# Patient Record
Sex: Female | Born: 1975 | Race: White | Hispanic: No | Marital: Married | Smoking: Former smoker
Health system: Southern US, Community
[De-identification: ages and names within clinical notes are randomized; demographics above are authoritative.]

## PROBLEM LIST (undated history)

## (undated) DIAGNOSIS — D333 Benign neoplasm of cranial nerves: Secondary | ICD-10-CM

## (undated) HISTORY — PX: HERNIA REPAIR: SHX51

## (undated) HISTORY — PX: OTHER SURGICAL HISTORY: SHX169

## (undated) HISTORY — PX: ABDOMINAL HYSTERECTOMY: SHX81

---

## 2016-04-12 ENCOUNTER — Emergency Department (HOSPITAL_COMMUNITY)
Admission: EM | Admit: 2016-04-12 | Discharge: 2016-04-12 | Disposition: A | Discharge status: Home / Self Care | Payer: 59 | Attending: Emergency Medicine | Admitting: Emergency Medicine

## 2016-04-12 ENCOUNTER — Emergency Department (HOSPITAL_COMMUNITY): Payer: 59

## 2016-04-12 ENCOUNTER — Encounter (HOSPITAL_COMMUNITY): Payer: Self-pay

## 2016-04-12 DIAGNOSIS — Z87891 Personal history of nicotine dependence: Secondary | ICD-10-CM | POA: Insufficient documentation

## 2016-04-12 DIAGNOSIS — R079 Chest pain, unspecified: Secondary | ICD-10-CM | POA: Diagnosis present

## 2016-04-12 DIAGNOSIS — K219 Gastro-esophageal reflux disease without esophagitis: Secondary | ICD-10-CM | POA: Insufficient documentation

## 2016-04-12 DIAGNOSIS — R0789 Other chest pain: Secondary | ICD-10-CM | POA: Diagnosis not present

## 2016-04-12 DIAGNOSIS — Z86018 Personal history of other benign neoplasm: Secondary | ICD-10-CM | POA: Diagnosis not present

## 2016-04-12 HISTORY — DX: Benign neoplasm of cranial nerves: D33.3

## 2016-04-12 LAB — CBC
HEMATOCRIT: 39 % (ref 36.0–46.0)
Hemoglobin: 12.8 g/dL (ref 12.0–15.0)
MCH: 30.3 pg (ref 26.0–34.0)
MCHC: 32.8 g/dL (ref 30.0–36.0)
MCV: 92.2 fL (ref 78.0–100.0)
Platelets: 266 10*3/uL (ref 150–400)
RBC: 4.23 MIL/uL (ref 3.87–5.11)
RDW: 14 % (ref 11.5–15.5)
WBC: 6.8 10*3/uL (ref 4.0–10.5)

## 2016-04-12 LAB — BASIC METABOLIC PANEL
Anion gap: 9 (ref 5–15)
BUN: 9 mg/dL (ref 6–20)
CO2: 21 mmol/L — ABNORMAL LOW (ref 22–32)
CREATININE: 0.61 mg/dL (ref 0.44–1.00)
Calcium: 8.8 mg/dL — ABNORMAL LOW (ref 8.9–10.3)
Chloride: 107 mmol/L (ref 101–111)
GFR calc Af Amer: >60 mL/min (ref >60)
GLUCOSE: 102 mg/dL — AB (ref 65–99)
POTASSIUM: 4.3 mmol/L (ref 3.5–5.1)
Sodium: 137 mmol/L (ref 135–145)

## 2016-04-12 LAB — I-STAT TROPONIN, ED: Troponin i, poc: 0.02 ng/mL (ref 0.00–0.08)

## 2016-04-12 MED ORDER — PANTOPRAZOLE SODIUM 20 MG PO TBEC: 20.0000 mg | DELAYED_RELEASE_TABLET | Freq: Every day | ORAL | 0 refills | Status: AC

## 2016-04-12 MED ORDER — GI COCKTAIL ~~LOC~~
30.0000 mL | Freq: Once | ORAL | Status: AC
  Administered 2016-04-12: 30 mL via ORAL
  Filled 2016-04-12: qty 30

## 2016-04-12 MED ORDER — ONDANSETRON 4 MG PO TBDP
4.0000 mg | ORAL_TABLET | Freq: Once | ORAL | Status: AC
  Administered 2016-04-12: 4 mg via ORAL
  Filled 2016-04-12: qty 1

## 2016-04-12 MED ORDER — ONDANSETRON 4 MG PO TBDP: 4.0000 mg | ORAL_TABLET | Freq: Three times a day (TID) | ORAL | 0 refills | Status: AC | PRN

## 2016-04-12 NOTE — ED Notes (Signed)
Pt states that nausea and vomitting have improved PA made aware will discuss discharge with pt.

## 2016-04-12 NOTE — ED Notes (Signed)
Pt brought in to the room appears in no distress husband with pt ekg complete pt placed on monitor safety maintained

## 2016-04-12 NOTE — ED Provider Notes (Signed)
[facility] DEPT Provider Note   CSN: [id number] Arrival date & time: 04/12/16  1158     History   Chief Complaint Chief Complaint  Patient presents with  . Chest Pain    pt c/o central chest pain that radiates to the back as well as heartburn pain    HPI Chelsey Watson is a 40 y.o. female.  HPI   Patient is a 40 year old female presents to the emergency room with complaint of upper central chest pain that radiates up her neck, throat and back that has gradually worsened today after onset at 9 AM this morning. Pain is described as burning and sharp with associated reflux.  Her pain has been constant since 9 AM this morning, rated 4/10. No improvement after eating a banana.  She reports some anxiety after the onset of her chest pain with associated intermittent palpitations and shortness of breath. There is no change in severity of pain with deep inspiration, exertion, positional changes, movement or palpation.  No other alleviating or aggravating factors. She denies cough, wheeze, fever, chills, sweats, orthopnea, lower extremity edema, PND, nausea, vomiting, abdominal pain, near syncope.  She Quit smoking 4 years ago. She denies any history of hypertension, diabetes, anxiety.   Past Medical History:  Diagnosis Date  . Acoustic neuroma (HCC)     There are no active problems to display for this patient.   Past Surgical History:  Procedure Laterality Date  . ABDOMINAL HYSTERECTOMY    . gamma knife    . HERNIA REPAIR    . laporostomy      OB History    No data available       Home Medications    Prior to Admission medications   Medication Sig Start Date End Date Taking? Authorizing Provider  valACYclovir (VALTREX) 1000 MG tablet Take 1,000 mg by mouth 2 (two) times daily.   Yes Historical Provider, MD  ondansetron (ZOFRAN ODT) 4 MG disintegrating tablet Take 1 tablet (4 mg total) by mouth every 8 (eight) hours as needed for nausea or vomiting. 04/12/16   Delsa Grana, PA-C  pantoprazole (PROTONIX) 20 MG tablet Take 1 tablet (20 mg total) by mouth daily. 04/12/16   Delsa Grana, PA-C    Family History No family history on file.  Social History Social History  Substance Use Topics  . Smoking status: Former Research scientist (life sciences)  . Smokeless tobacco: Never Used  . Alcohol use Yes     Allergies   Amitriptyline; Erythromycin; Neurontin [gabapentin]; and Penicillins   Review of Systems Review of Systems  Constitutional: Negative for activity change, appetite change, chills, diaphoresis, fatigue and fever.  HENT: Negative.  Negative for congestion.   Eyes: Negative.   Respiratory: Negative for apnea, cough, chest tightness, wheezing and stridor.   Cardiovascular: Positive for chest pain and palpitations. Negative for leg swelling.  Gastrointestinal: Negative for abdominal distention, abdominal pain, diarrhea, nausea and vomiting.  Endocrine: Negative.   Genitourinary: Negative.   Musculoskeletal: Negative.   Skin: Negative.  Negative for color change and pallor.  Allergic/Immunologic: Negative.   Neurological: Negative for syncope, weakness and numbness.  Psychiatric/Behavioral: The patient is nervous/anxious.   All other systems reviewed and are negative.    Physical Exam Updated Vital Signs BP 108/73   Pulse 60   Temp 98.1 F (36.7 C) (Oral)   Resp 15   Ht 4' 10"  (1.473 m)   Wt 73.5 kg   SpO2 98%   BMI 33.86 kg/m   Physical Exam  Constitutional: She is oriented to person, place, and time. Vital signs are normal. She appears well-developed and well-nourished. She is cooperative.  Non-toxic appearance. She does not have a sickly appearance. She does not appear ill. No distress.  HENT:  Head: Normocephalic and atraumatic.  Nose: Nose normal.  Mouth/Throat: Oropharynx is clear and moist. No oropharyngeal exudate.  Eyes: Conjunctivae and EOM are normal. Pupils are equal, round, and reactive to light. Right eye exhibits no discharge. Left eye  exhibits no discharge. No scleral icterus.  Neck: Normal range of motion. Neck supple. No JVD present. No tracheal deviation present.  Cardiovascular: Normal rate, regular rhythm, normal heart sounds and intact distal pulses.   No extrasystoles are present. PMI is not displaced.  Exam reveals no gallop and no friction rub.   No murmur heard. No lower extremity edema, symmetrical radial pulse 2+, dorsal pedis pulses symmetrical 2+  Pulmonary/Chest: Effort normal and breath sounds normal. No accessory muscle usage or stridor. No tachypnea. No respiratory distress. She has no decreased breath sounds. She has no wheezes. She has no rhonchi. She has no rales. She exhibits no tenderness.  Abdominal: Soft. Bowel sounds are normal. She exhibits no distension and no mass. There is no tenderness. There is no rebound and no guarding.  Musculoskeletal: Normal range of motion. She exhibits no deformity.  Neurological: She is alert and oriented to person, place, and time. She exhibits normal muscle tone. Coordination normal.  Skin: Skin is warm and dry. Capillary refill takes less than 2 seconds. No rash noted. She is not diaphoretic. No erythema. No pallor.  Psychiatric: She has a normal mood and affect. Her behavior is normal. Judgment and thought content normal.  Nursing note and vitals reviewed.    ED Treatments / Results  Labs (all labs ordered are listed, but only abnormal results are displayed) Labs Reviewed  BASIC METABOLIC PANEL - Abnormal; Notable for the following:       Result Value   CO2 21 (*)    Glucose, Bld 102 (*)    Calcium 8.8 (*)    All other components within normal limits  CBC  I-STAT TROPOININ, ED    EKG  EKG Interpretation  Date/Time:  Monday April 12 2016 12:06:11 EDT Ventricular Rate:  63 PR Interval:    QRS Duration: 94 QT Interval:  459 QTC Calculation: 470 R Axis:   79 Text Interpretation:  Sinus rhythm Low voltage, precordial leads Confirmed by Jeneen Rinks  MD,  [place] ([id number]) on 04/12/2016 4:21:19 PM       Radiology Dg Chest 2 View  Result Date: 04/12/2016 CLINICAL DATA:  Mid chest pain radiating to the back EXAM: CHEST  2 VIEW COMPARISON:  None. FINDINGS: There is mild bilateral interstitial thickening. There is no focal parenchymal opacity. There is no pleural effusion or pneumothorax. The heart size is top normal. The osseous structures are unremarkable. IMPRESSION: No active cardiopulmonary disease. Electronically Signed   By: Kathreen Devoid   On: 04/12/2016 13:16    Procedures Procedures (including critical care time)  Medications Ordered in ED Medications  gi cocktail (Maalox,Lidocaine,Donnatal) (30 mLs Oral Given 04/12/16 1344)  ondansetron (ZOFRAN-ODT) disintegrating tablet 4 mg (4 mg Oral Given 04/12/16 1353)     Initial Impression / Assessment and Plan / ED Course  I have reviewed the triage vital signs and the nursing notes.  Pertinent labs & imaging results that were available during my care of the patient were reviewed by me and considered in my medical  decision making (see chart for details).  Clinical Course  40 year old female presents with high central chest pain described as burning and sharp with associated acid reflux.   History non-concerning for ACS, patient is low risk, heart score of 1 - parents with heart disease.   Patient complained of palpitations, cardiac monitoring was reviewed, w/o arrhythmia.   GI cocktail improved pain while in the ER.   Patient is to be discharged with recommendation to follow up with PCP in regards to today's hospital visit. Chest pain is not likely of cardiac or pulmonary etiology d/t presentation, perc negative, VSS, no tracheal deviation, no JVD or new murmur, RRR, breath sounds equal bilaterally, EKG without acute abnormalities, negative troponin, and negative CXR. Pt has been advised start a PPI and return to the ED is CP becomes exertional, associated with diaphoresis or nausea, radiates to  left jaw/arm, worsens or becomes concerning in any way.  Pt agrees to follow up with her PCP this week.   Final Clinical Impressions(s) / ED Diagnoses   Final diagnoses:  Atypical chest pain  Gastroesophageal reflux disease, esophagitis presence not specified    New Prescriptions Discharge Medication List as of 04/12/2016  2:32 PM    START taking these medications   Details  ondansetron (ZOFRAN ODT) 4 MG disintegrating tablet Take 1 tablet (4 mg total) by mouth every 8 (eight) hours as needed for nausea or vomiting., Starting Mon 04/12/2016, Print    pantoprazole (PROTONIX) 20 MG tablet Take 1 tablet (20 mg total) by mouth daily., Starting Mon 04/12/2016, Print         Delsa Grana, PA-C 04/30/16 2202    Tanna Furry, MD 05/16/16 2030

## 2016-04-29 ENCOUNTER — Telehealth: Payer: Self-pay | Admitting: *Deleted

## 2016-04-29 NOTE — Telephone Encounter (Signed)
Pharmacy called related to Rx: ondansetron (ZOFRAN ODT) 4 MG disintegrating tablet not being covered by insurance...EDCM clarified with EDP ([facility]) to change Rx to: ondansetron (ZOFRAN ODT) 4 MG tablet.

## 2018-09-05 IMAGING — CR DG CHEST 2V
2 series · 2 of 2 positions shown · non-contrast
Comparison: None.

CLINICAL DATA: Mid chest pain radiating to the back

EXAM:
CHEST  2 VIEW

[chest pa]
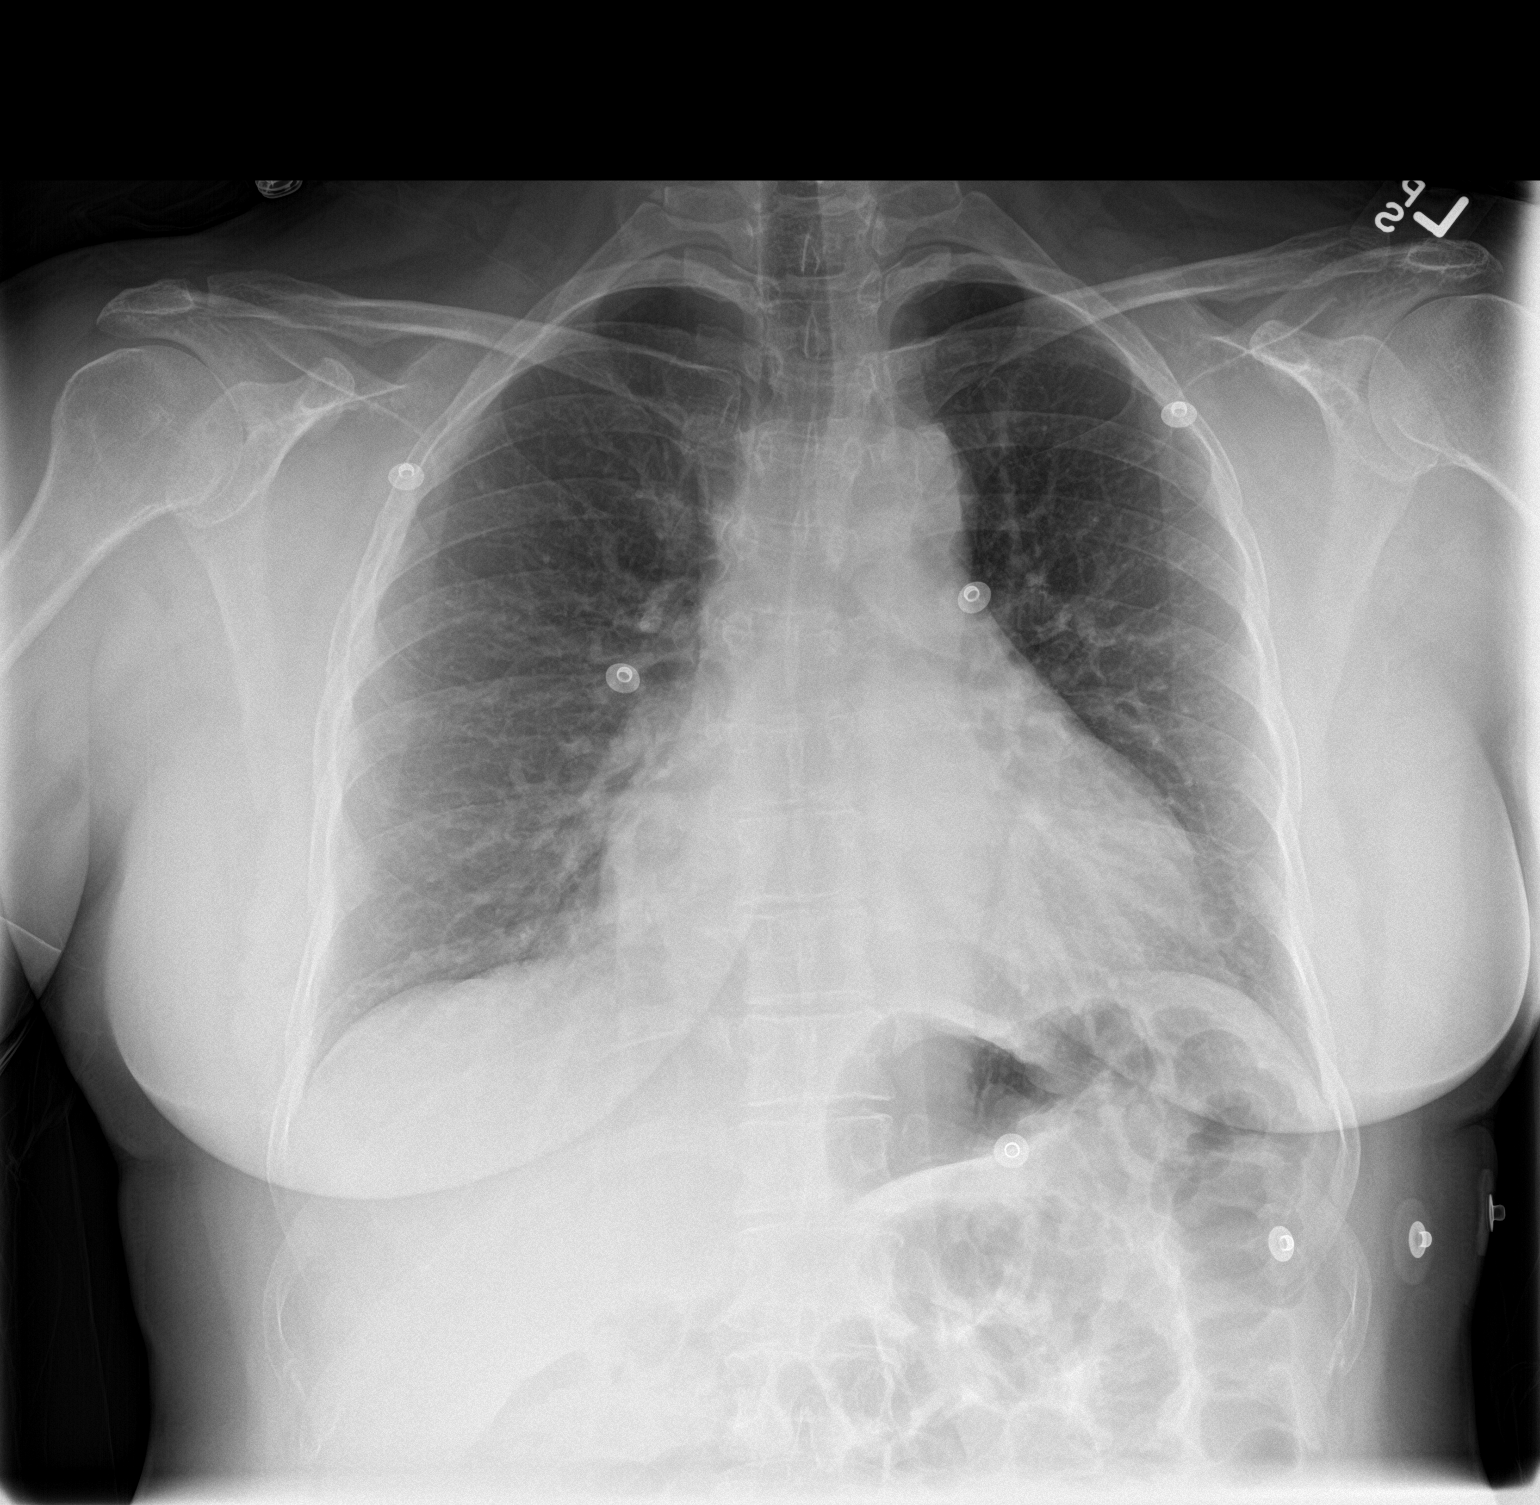

[chest lat]
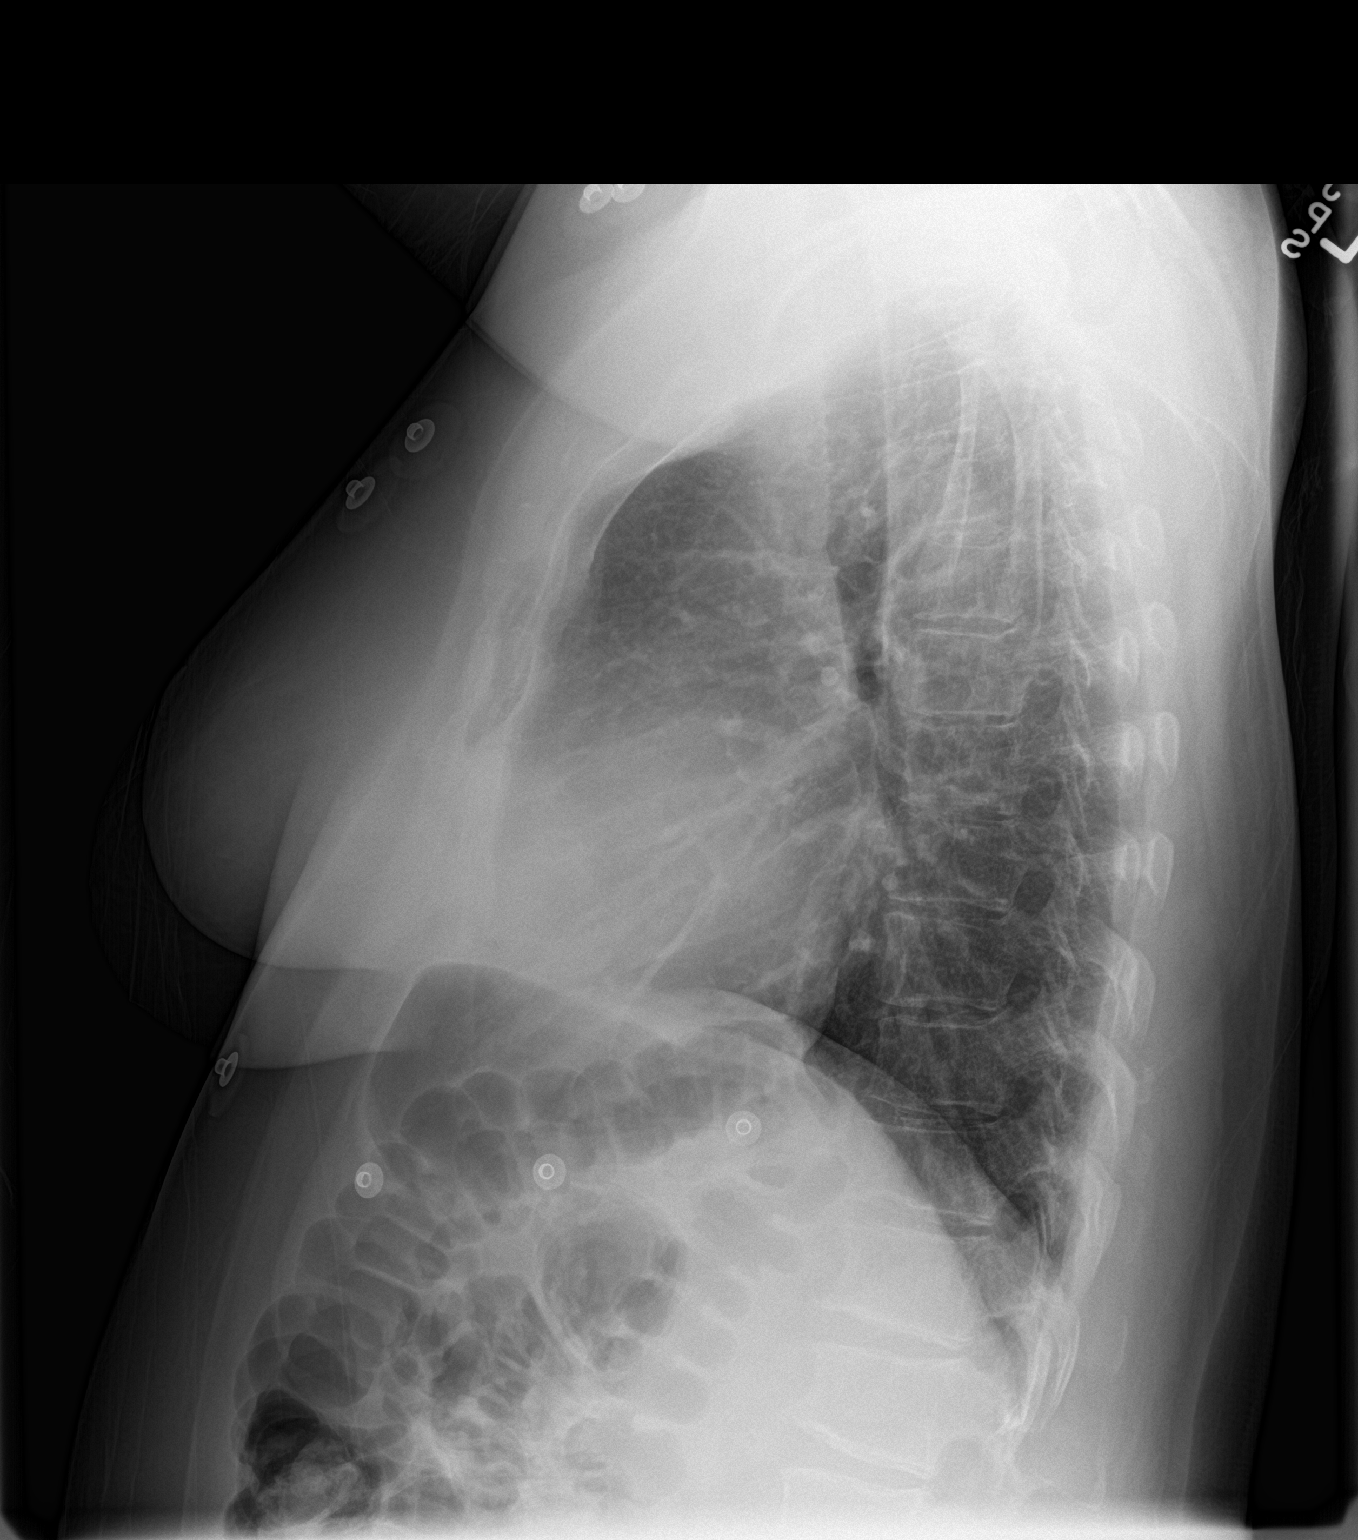

[2 of 2 positions shown; findings below may reference images not displayed]

FINDINGS: There is mild bilateral interstitial thickening. There is no focal
parenchymal opacity. There is no pleural effusion or pneumothorax.
The heart size is top normal.

The osseous structures are unremarkable.
IMPRESSION: No active cardiopulmonary disease.

## 2021-12-07 ENCOUNTER — Other Ambulatory Visit: Payer: Self-pay | Admitting: Physician Assistant

## 2021-12-07 DIAGNOSIS — Z1231 Encounter for screening mammogram for malignant neoplasm of breast: Secondary | ICD-10-CM

## 2022-01-08 ENCOUNTER — Other Ambulatory Visit: Payer: Self-pay | Admitting: Physician Assistant

## 2022-01-08 ENCOUNTER — Encounter: Payer: Self-pay | Admitting: Radiology

## 2022-01-08 ENCOUNTER — Ambulatory Visit
Admission: RE | Admit: 2022-01-08 | Discharge: 2022-01-08 | Disposition: A | Discharge status: Home / Self Care | Payer: 59 | Source: Ambulatory Visit | Attending: Physician Assistant | Admitting: Physician Assistant

## 2022-01-08 DIAGNOSIS — Z1231 Encounter for screening mammogram for malignant neoplasm of breast: Secondary | ICD-10-CM
